# Patient Record
Sex: Female | Born: 1975 | Race: White | Hispanic: No | Marital: Married | Smoking: Current every day smoker
Health system: Southern US, Community
[De-identification: ages and names within clinical notes are randomized; demographics above are authoritative.]

## PROBLEM LIST (undated history)

## (undated) DIAGNOSIS — R5382 Chronic fatigue, unspecified: Secondary | ICD-10-CM

## (undated) DIAGNOSIS — F445 Conversion disorder with seizures or convulsions: Secondary | ICD-10-CM

## (undated) DIAGNOSIS — F419 Anxiety disorder, unspecified: Secondary | ICD-10-CM

## (undated) DIAGNOSIS — M797 Fibromyalgia: Secondary | ICD-10-CM

## (undated) DIAGNOSIS — G243 Spasmodic torticollis: Secondary | ICD-10-CM

## (undated) DIAGNOSIS — G9332 Myalgic encephalomyelitis/chronic fatigue syndrome: Secondary | ICD-10-CM

## (undated) HISTORY — PX: ABDOMINAL HYSTERECTOMY: SHX81

---

## 2008-01-24 ENCOUNTER — Encounter: Admission: RE | Admit: 2008-01-24 | Discharge: 2008-01-24 | Payer: Self-pay | Admitting: Emergency Medicine

## 2008-01-31 ENCOUNTER — Encounter: Admission: RE | Admit: 2008-01-31 | Discharge: 2008-01-31 | Payer: Self-pay | Admitting: Emergency Medicine

## 2008-02-25 ENCOUNTER — Encounter: Admission: RE | Admit: 2008-02-25 | Discharge: 2008-02-25 | Payer: Self-pay | Admitting: Emergency Medicine

## 2018-02-11 ENCOUNTER — Encounter: Payer: Self-pay | Admitting: Emergency Medicine

## 2018-02-11 ENCOUNTER — Emergency Department
Admission: EM | Admit: 2018-02-11 | Discharge: 2018-02-12 | Disposition: A | Discharge status: Home / Self Care | Payer: Self-pay | Attending: Emergency Medicine | Admitting: Emergency Medicine

## 2018-02-11 ENCOUNTER — Other Ambulatory Visit: Payer: Self-pay

## 2018-02-11 DIAGNOSIS — F17200 Nicotine dependence, unspecified, uncomplicated: Secondary | ICD-10-CM | POA: Insufficient documentation

## 2018-02-11 DIAGNOSIS — R45851 Suicidal ideations: Secondary | ICD-10-CM | POA: Insufficient documentation

## 2018-02-11 DIAGNOSIS — F1910 Other psychoactive substance abuse, uncomplicated: Secondary | ICD-10-CM | POA: Insufficient documentation

## 2018-02-11 DIAGNOSIS — F320 Major depressive disorder, single episode, mild: Secondary | ICD-10-CM | POA: Insufficient documentation

## 2018-02-11 DIAGNOSIS — F419 Anxiety disorder, unspecified: Secondary | ICD-10-CM | POA: Insufficient documentation

## 2018-02-11 HISTORY — DX: Spasmodic torticollis: G24.3

## 2018-02-11 HISTORY — DX: Anxiety disorder, unspecified: F41.9

## 2018-02-11 HISTORY — DX: Fibromyalgia: M79.7

## 2018-02-11 HISTORY — DX: Chronic fatigue, unspecified: R53.82

## 2018-02-11 HISTORY — DX: Conversion disorder with seizures or convulsions: F44.5

## 2018-02-11 HISTORY — DX: Myalgic encephalomyelitis/chronic fatigue syndrome: G93.32

## 2018-02-11 LAB — COMPREHENSIVE METABOLIC PANEL
ALK PHOS: 51 U/L (ref 38–126)
ALT: 12 U/L (ref 0–44)
ANION GAP: 5 (ref 5–15)
AST: 13 U/L — ABNORMAL LOW (ref 15–41)
Albumin: 4.2 g/dL (ref 3.5–5.0)
BUN: 11 mg/dL (ref 6–20)
CALCIUM: 9.5 mg/dL (ref 8.9–10.3)
CO2: 35 mmol/L — AB (ref 22–32)
Chloride: 102 mmol/L (ref 98–111)
Creatinine, Ser: 0.68 mg/dL (ref 0.44–1.00)
GFR calc non Af Amer: >60 mL/min (ref >60)
Glucose, Bld: 82 mg/dL (ref 70–99)
Potassium: 4.1 mmol/L (ref 3.5–5.1)
SODIUM: 142 mmol/L (ref 135–145)
TOTAL PROTEIN: 6.9 g/dL (ref 6.5–8.1)
Total Bilirubin: 0.4 mg/dL (ref 0.3–1.2)

## 2018-02-11 LAB — URINE DRUG SCREEN, QUALITATIVE (ARMC ONLY)
Amphetamines, Ur Screen: POSITIVE — AB
BARBITURATES, UR SCREEN: NOT DETECTED
BENZODIAZEPINE, UR SCRN: NOT DETECTED
CANNABINOID 50 NG, UR ~~LOC~~: NOT DETECTED
Cocaine Metabolite,Ur ~~LOC~~: POSITIVE — AB
MDMA (Ecstasy)Ur Screen: NOT DETECTED
Methadone Scn, Ur: NOT DETECTED
Opiate, Ur Screen: NOT DETECTED
Phencyclidine (PCP) Ur S: NOT DETECTED
TRICYCLIC, UR SCREEN: NOT DETECTED

## 2018-02-11 LAB — CBC
HCT: 41.5 % (ref 36.0–46.0)
Hemoglobin: 13.1 g/dL (ref 12.0–15.0)
MCH: 26.3 pg (ref 26.0–34.0)
MCHC: 31.6 g/dL (ref 30.0–36.0)
MCV: 83.3 fL (ref 80.0–100.0)
PLATELETS: 357 10*3/uL (ref 150–400)
RBC: 4.98 MIL/uL (ref 3.87–5.11)
RDW: 13.1 % (ref 11.5–15.5)
WBC: 8.9 10*3/uL (ref 4.0–10.5)
nRBC: 0 % (ref 0.0–0.2)

## 2018-02-11 LAB — ACETAMINOPHEN LEVEL

## 2018-02-11 LAB — ETHANOL: Alcohol, Ethyl (B): <10 mg/dL (ref <10)

## 2018-02-11 LAB — SALICYLATE LEVEL

## 2018-02-11 NOTE — ED Notes (Signed)
Belongings secured: 1 pair sweat pants, 1 bra, 1 pair sandals, 1 shirt, 1 cell phone.

## 2018-02-11 NOTE — ED Provider Notes (Signed)
[facility] Emergency Department Provider Note ____________________________________________   First MD Initiated Contact with Patient 02/11/18 2038     (approximate)  I have reviewed the triage vital signs and the nursing notes.   HISTORY  Chief Complaint Psychiatric Evaluation    HPI Victoria Marshall is a 42 y.o. female with PMH as noted below who presents for evaluation of suicidal ideation.  Per the patient, she had an argument with her son and stated something along the lines of wanting to put a bullet in her head.  The son then called the police and the patient was placed under involuntary commitment.  The patient states that she just said this but never actually intended to hurt herself.  She does report a history of depression and anxiety but is not on medication, and denies any prior suicidal attempts or suicidal ideation.  She denies any acute medical complaints at this time.  Past Medical History:  Diagnosis Date  . Anxiety   . Cervical dystonia   . Chronic fatigue syndrome   . Fibromyalgia   . Psychogenic nonepileptic seizure     There are no active problems to display for this patient.   Past Surgical History:  Procedure Laterality Date  . ABDOMINAL HYSTERECTOMY    . CESAREAN SECTION     x4    Prior to Admission medications   Not on File    Allergies Patient has no known allergies.  No family history on file.  Social History Social History   Tobacco Use  . Smoking status: Current Every Day Smoker  . Smokeless tobacco: Never Used  Substance Use Topics  . Alcohol use: Never    Frequency: Never  . Drug use: Not on file    Review of Systems  Constitutional: No fever. Eyes: No redness. ENT: No sore throat. Cardiovascular: Denies chest pain. Respiratory: Denies shortness of breath. Gastrointestinal: No vomiting.  Genitourinary: Negative for dysuria.  Musculoskeletal: Negative for back pain. Skin: Negative for  rash. Neurological: Negative for headache.   ____________________________________________   PHYSICAL EXAM:  VITAL SIGNS: ED Triage Vitals  Enc Vitals Group     BP 02/11/18 2014 131/66     Pulse Rate 02/11/18 2014 91     Resp 02/11/18 2014 18     Temp 02/11/18 2014 98.2 F (36.8 C)     Temp Source 02/11/18 2014 Oral     SpO2 02/11/18 2014 100 %     Weight 02/11/18 2015 142 lb (64.4 kg)     Height 02/11/18 2015 5\' 3"  (1.6 m)     Head Circumference --      Peak Flow --      Pain Score 02/11/18 2015 0     Pain Loc --      Pain Edu? --      Excl. in GC? --     Constitutional: Alert and oriented. Well appearing and in no acute distress. Eyes: Conjunctivae are normal.  Head: Atraumatic. Nose: No congestion/rhinnorhea. Mouth/Throat: Mucous membranes are moist.   Neck: Normal range of motion.  Cardiovascular: Good peripheral circulation. Respiratory: Normal respiratory effort.  Gastrointestinal:  No distention.  Musculoskeletal:  Extremities warm and well perfused.  Neurologic:  Normal speech and language. No gross focal neurologic deficits are appreciated.  Skin:  Skin is warm and dry. No rash noted. Psychiatric: Mood and affect are normal. Speech and behavior are normal.  ____________________________________________   LABS (all labs ordered are listed, but only abnormal results are  displayed)  Labs Reviewed  COMPREHENSIVE METABOLIC PANEL - Abnormal; Notable for the following components:      Result Value   CO2 35 (*)    AST 13 (*)    All other components within normal limits  ACETAMINOPHEN LEVEL - Abnormal; Notable for the following components:   Acetaminophen (Tylenol), Serum <10 (*)    All other components within normal limits  URINE DRUG SCREEN, QUALITATIVE ([facility] ONLY) - Abnormal; Notable for the following components:   Amphetamines, Ur Screen POSITIVE (*)    Cocaine Metabolite,Ur [place] POSITIVE (*)    All other components within normal limits  ETHANOL   SALICYLATE LEVEL  CBC   ____________________________________________  EKG   ____________________________________________  RADIOLOGY    ____________________________________________   PROCEDURES  Procedure(s) performed: No  Procedures  Critical Care performed: No ____________________________________________   INITIAL IMPRESSION / ASSESSMENT AND PLAN / ED COURSE  Pertinent labs & imaging results that were available during my care of the patient were reviewed by me and considered in my medical decision making (see chart for details).  42 year old female with PMH as noted above presents under involuntary commitment with concern for suicidal ideation.  Patient states that she made a suicidal statement while in an argument with her son but never actually felt suicidal and denies SI at this time.  She also denies any medical complaints.  On exam the patient is comfortable appearing and her vital signs are normal.  We will obtain TTS consult and disposition will be per behavioral health team recommendations.  ----------------------------------------- 10:41 PM on 02/11/2018 -----------------------------------------  Pending TTS consult and behavioral team recommendations for disposition.  ____________________________________________   FINAL CLINICAL IMPRESSION(S) / ED DIAGNOSES  Final diagnoses:  Suicidal thoughts      NEW MEDICATIONS STARTED DURING THIS VISIT:  New Prescriptions   No medications on file     Note:  This document was prepared using Dragon voice recognition software and may include unintentional dictation errors.    Dionne Bucy, MD 02/11/18 2241

## 2018-02-11 NOTE — ED Triage Notes (Signed)
Patient states she got into argument with son today, states she got mad and stated she "just wanted to put a bullet in her head sometimes". Patient states she didn't really mean it and said it in the heat of the moment. Patient states she doesn't know why she said it, but left the house at that time feeling like it would calm things down. Patient states she then became IVC because of leaving.  Patient states she knows she has anxiety, believes she may have undiagnosed with depression. States husband died one year ago in 01-18-2023.

## 2018-02-12 NOTE — Discharge Instructions (Addendum)
Please follow-up with [facility] to help with your depression symptoms.  They may be to help you little bit with your drug abuse as well.  Please return here for any further problems.
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
## 2018-02-12 NOTE — ED Notes (Signed)
Report given to Dr Mittal (SOC doctor).  

## 2018-02-12 NOTE — ED Provider Notes (Signed)
-----------------------------------------   6:50 AM on 02/12/2018 -----------------------------------------   Blood pressure 129/80, pulse 91, temperature 98 F (36.7 C), temperature source Oral, resp. rate 18, height 5\' 3"  (1.6 m), weight 64.4 kg, SpO2 100 %.  The patient had no acute events since last update.  Calm and cooperative at this time.  Disposition is pending Psychiatry/Behavioral Medicine team recommendations.      Irean Hong, MD 02/12/18 [phone]

## 2018-02-12 NOTE — ED Notes (Addendum)
Patient discharged home with mother, patient received discharge papers and referral to [facility]. Patient received belongings and verbalized she has received all of her belongings. Patient appropriate and cooperative, Denies SI/HI AVH. Vital signs taken. NAD noted.

## 2018-02-12 NOTE — ED Notes (Signed)
BEHAVIORAL HEALTH ROUNDING Patient sleeping: No. Patient alert and oriented: yes Behavior appropriate: Yes.  ; If no, describe:  Nutrition and fluids offered: yes Toileting and hygiene offered: Yes  Sitter present: q15 minute observations and security monitoring Law enforcement present: Yes    
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
## 2018-08-15 ENCOUNTER — Other Ambulatory Visit: Payer: Self-pay

## 2018-08-15 ENCOUNTER — Emergency Department (HOSPITAL_COMMUNITY)
Admission: EM | Admit: 2018-08-15 | Discharge: 2018-08-15 | Disposition: A | Discharge status: Home / Self Care | Payer: Self-pay | Attending: Emergency Medicine | Admitting: Emergency Medicine

## 2018-08-15 ENCOUNTER — Encounter (HOSPITAL_COMMUNITY): Payer: Self-pay

## 2018-08-15 ENCOUNTER — Emergency Department (HOSPITAL_COMMUNITY): Payer: Self-pay

## 2018-08-15 DIAGNOSIS — K5900 Constipation, unspecified: Secondary | ICD-10-CM | POA: Insufficient documentation

## 2018-08-15 DIAGNOSIS — F17200 Nicotine dependence, unspecified, uncomplicated: Secondary | ICD-10-CM | POA: Insufficient documentation

## 2018-08-15 LAB — PREGNANCY, URINE: Preg Test, Ur: NEGATIVE

## 2018-08-15 LAB — URINALYSIS, ROUTINE W REFLEX MICROSCOPIC
Bilirubin Urine: NEGATIVE
Glucose, UA: NEGATIVE mg/dL
Hgb urine dipstick: NEGATIVE
Ketones, ur: NEGATIVE mg/dL
Leukocytes,Ua: NEGATIVE
Nitrite: NEGATIVE
Protein, ur: NEGATIVE mg/dL
Specific Gravity, Urine: 1.026 (ref 1.005–1.030)
pH: 5 (ref 5.0–8.0)

## 2018-08-15 MED ORDER — SENNOSIDES-DOCUSATE SODIUM 8.6-50 MG PO TABS: 2.0000 | ORAL_TABLET | Freq: Every day | ORAL | 0 refills | Status: DC

## 2018-08-15 MED ORDER — POLYETHYLENE GLYCOL 3350 17 G PO PACK: 17.0000 g | PACK | Freq: Two times a day (BID) | ORAL | 0 refills | Status: DC | PRN

## 2018-08-15 NOTE — ED Notes (Signed)
Patient transported to X-ray 

## 2018-08-15 NOTE — Discharge Instructions (Addendum)
You are seen in the emergency department today for constipation.  Your urine did not show a UTI.  X-ray did not show an obstruction.  You are sending you home with MiraLAX and senokot.  Take miralax daily and up to 3 times daily as needed for constipation.  Take two tablets of senokot daily.   We have prescribed you new medication(s) today. Discuss the medications prescribed today with your pharmacist as they can have adverse effects and interactions with your other medicines including over the counter and prescribed medications. Seek medical evaluation if you start to experience new or abnormal symptoms after taking one of these medicines, seek care immediately if you start to experience difficulty breathing, feeling of your throat closing, facial swelling, or rash as these could be indications of a more serious allergic reaction  Follow attached diet guidelines.  It is important that you maintain good hydration drinking 8 or more glasses per day.   Follow up with primary care within 3 days.  Return to the ER for new or worsening symptoms or any other concerns including but not limited to abdominal pain, inability to keep fluids down, no longer passing gas, or any other concerns.

## 2018-08-15 NOTE — ED Triage Notes (Signed)
Pt c/o constipation x3 weeks, nausea and vomiting

## 2018-08-15 NOTE — ED Provider Notes (Signed)
[facility] EMERGENCY DEPARTMENT Provider Note   CSN: [id number] Arrival date & time: 08/15/18  1422    History   Chief Complaint Chief Complaint  Patient presents with  . Abdominal Pain  . Constipation    HPI Victoria Marshall is a 43 y.o. female with a hx of tobacco abuse, fibromyalgia, & chronic fatigue syndrome who presents to the ED w/ complaints of constipation x 3 weeks. Patient notes issues w/ constipation, has really only had 1 true bowel movement in the past 3 weeks and this was 2 days prior during which she had to strain and passed hard stool. She is passing gas. She has had associated nausea w/ 3 episodes of emesis total over the past 1 week related to sxs, no emesis since BM, some nausea persists, tolerating PO though. She states sometimes her abdomen feels bloated, but not painful. She has had issues w/ constipation on and off throughout life, usually can adjust her diet and this will improve, has been trying to eat more vegetables, taking Miralax intermittently but not consistently without much change. Drinks about 4 glasses of water per day, does drink a lot of soda. Denies fever, hematemesis, melena, hematochezia, abdominal pain, chest pain, or dyspnea. On further questions she states it burned very mildly when she urinated earlier today, only happened once unsure if she is getting a UTI, no concern for STD. Prior abdominal surgeries including c-section x 4.      HPI  Past Medical History:  Diagnosis Date  . Anxiety   . Cervical dystonia   . Chronic fatigue syndrome   . Fibromyalgia   . Psychogenic nonepileptic seizure     There are no active problems to display for this patient.   Past Surgical History:  Procedure Laterality Date  . ABDOMINAL HYSTERECTOMY    . CESAREAN SECTION     x4     OB History   No obstetric history on file.      Home Medications    Prior to Admission medications   Not on File    Family History No family history  on file.  Social History Social History   Tobacco Use  . Smoking status: Current Every Day Smoker  . Smokeless tobacco: Never Used  Substance Use Topics  . Alcohol use: Never    Frequency: Never  . Drug use: Not on file     Allergies   Patient has no known allergies.   Review of Systems Review of Systems  Constitutional: Negative for chills and fever.  Respiratory: Negative for cough and shortness of breath.   Cardiovascular: Negative for chest pain.  Gastrointestinal: Positive for constipation, nausea and vomiting. Negative for abdominal pain, anal bleeding, blood in stool and diarrhea.       + for abdominal bloating  Genitourinary: Positive for dysuria. Negative for hematuria, vaginal bleeding and vaginal discharge.  All other systems reviewed and are negative.  Physical Exam Updated Vital Signs BP (!) 112/97 (BP Location: Right Arm)   Pulse 63   Temp 97.8 F (36.6 C) (Oral)   Resp (!) 26   SpO2 99%   Physical Exam Vitals signs and nursing note reviewed.  Constitutional:      General: She is not in acute distress.    Appearance: She is well-developed. She is not toxic-appearing.  HENT:     Head: Normocephalic and atraumatic.  Eyes:     General:        Right eye: No discharge.  Left eye: No discharge.     Conjunctiva/sclera: Conjunctivae normal.  Neck:     Musculoskeletal: Neck supple.  Cardiovascular:     Rate and Rhythm: Normal rate and regular rhythm.  Pulmonary:     Effort: Pulmonary effort is normal. No respiratory distress.     Breath sounds: Normal breath sounds. No wheezing, rhonchi or rales.  Abdominal:     General: There is no distension.     Palpations: Abdomen is soft.     Tenderness: There is no abdominal tenderness. There is no guarding or rebound.  Genitourinary:    Comments: Deferred Skin:    General: Skin is warm and dry.     Findings: No rash.  Neurological:     Mental Status: She is alert.     Comments: Clear speech.    Psychiatric:        Behavior: Behavior normal.    ED Treatments / Results  Labs (all labs ordered are listed, but only abnormal results are displayed) Labs Reviewed  URINALYSIS, ROUTINE W REFLEX MICROSCOPIC - Abnormal; Notable for the following components:      Result Value   APPearance CLOUDY (*)    All other components within normal limits  PREGNANCY, URINE    EKG None  Radiology Dg Abd Acute W/chest  Result Date: 08/15/2018 CLINICAL DATA:  Nausea, vomiting, constipation. EXAM: DG ABDOMEN ACUTE W/ 1V CHEST COMPARISON:  None. FINDINGS: There is no evidence of dilated bowel loops or free intraperitoneal air. No radiopaque calculi or other significant radiographic abnormality is seen. Heart size and mediastinal contours are within normal limits. Both lungs are clear. IMPRESSION: No evidence of bowel obstruction or ileus. No acute cardiopulmonary disease. Electronically Signed   By: Lupita RaiderJames  Green Jr M.D.   On: 08/15/2018 15:34    Procedures Procedures (including critical care time)  Medications Ordered in ED Medications - No data to display   Initial Impression / Assessment and Plan / ED Course  I have reviewed the triage vital signs and the nursing notes.  Pertinent labs & imaging results that were available during my care of the patient were reviewed by me and considered in my medical decision making (see chart for details).  Patient presents to the emergency department w/ complaints of constipation, did have some episodes of emesis earlier last week which have not reoccurred since having a hard BM. Nontoxic appearing, no apparent distress, vitals without significant abnormality- RN documented RR of 26 likely based on inaccurate monitor- she has not been tachypneic throughout ER stay. Abdomen is soft & non-tender w/o peritoneal signs, still passing gas, did have a BM 2 days prior, abdominal x-ray without concerning findings, do not suspect obstruction/perforation. Additional w/  nontender abdomen do not suspect appendicitis, diverticulitis, pancreatitis, or cholecystitis. UA without UTI w/ mention of dysuria x 1, no concern for STD per patient. preg test negative.  Overall constipation appears to be etiology of patient's sxs. I have educated patient on importance of good oral hydration & diet recommendations. Will give prescription for Senokot & miralax- discussed taking this medications consistently for them to be effective. Appears appropriate for discharge home. PCP follow up w/ strict ER return precautions. I discussed results, treatment plan, need for follow-up, and return precautions with the patient. Provided opportunity for questions, patient confirmed understanding and is in agreement with plan.    Vitals:   08/15/18 1445 08/15/18 1606  BP:  110/64  Pulse: 63 (!) 59  Resp: (!) 26 17  Temp:  98.5 F (36.9 C)  SpO2: 99% 100%     Final Clinical Impressions(s) / ED Diagnoses   Final diagnoses:  Constipation, unspecified constipation type    ED Discharge Orders         Ordered    polyethylene glycol (MIRALAX) 17 g packet  2 times daily PRN     08/15/18 1554    senna-docusate (SENOKOT-S) 8.6-50 MG tablet  Daily     08/15/18 1554           Janayla Marik, [place] R, PA-C 08/15/18 1615    Margarita Grizzle, MD 08/15/18 1657

## 2018-10-22 ENCOUNTER — Other Ambulatory Visit: Payer: Self-pay

## 2018-10-22 DIAGNOSIS — Z20822 Contact with and (suspected) exposure to covid-19: Secondary | ICD-10-CM

## 2018-10-26 LAB — NOVEL CORONAVIRUS, NAA: SARS-CoV-2, NAA: NOT DETECTED

## 2018-12-19 ENCOUNTER — Encounter (HOSPITAL_COMMUNITY): Payer: Self-pay | Admitting: Emergency Medicine

## 2018-12-19 ENCOUNTER — Emergency Department (HOSPITAL_COMMUNITY)
Admission: EM | Admit: 2018-12-19 | Discharge: 2018-12-19 | Disposition: A | Discharge status: Home / Self Care | Payer: Self-pay | Attending: Emergency Medicine | Admitting: Emergency Medicine

## 2018-12-19 ENCOUNTER — Other Ambulatory Visit: Payer: Self-pay

## 2018-12-19 ENCOUNTER — Emergency Department (HOSPITAL_COMMUNITY): Payer: Self-pay

## 2018-12-19 DIAGNOSIS — F17200 Nicotine dependence, unspecified, uncomplicated: Secondary | ICD-10-CM | POA: Insufficient documentation

## 2018-12-19 DIAGNOSIS — M549 Dorsalgia, unspecified: Secondary | ICD-10-CM | POA: Insufficient documentation

## 2018-12-19 DIAGNOSIS — Z041 Encounter for examination and observation following transport accident: Secondary | ICD-10-CM | POA: Insufficient documentation

## 2018-12-19 DIAGNOSIS — M542 Cervicalgia: Secondary | ICD-10-CM | POA: Insufficient documentation

## 2018-12-19 DIAGNOSIS — R42 Dizziness and giddiness: Secondary | ICD-10-CM | POA: Insufficient documentation

## 2018-12-19 DIAGNOSIS — R110 Nausea: Secondary | ICD-10-CM | POA: Insufficient documentation

## 2018-12-19 MED ORDER — ONDANSETRON 4 MG PO TBDP: 4.0000 mg | ORAL_TABLET | Freq: Three times a day (TID) | ORAL | 0 refills | Status: AC | PRN

## 2018-12-19 MED ORDER — METHOCARBAMOL 500 MG PO TABS: 500.0000 mg | ORAL_TABLET | Freq: Two times a day (BID) | ORAL | 0 refills | Status: AC

## 2018-12-19 MED ORDER — IBUPROFEN 400 MG PO TABS
600.0000 mg | ORAL_TABLET | Freq: Once | ORAL | Status: AC
  Administered 2018-12-19: 600 mg via ORAL
  Filled 2018-12-19: qty 1

## 2018-12-19 MED ORDER — LIDOCAINE 5 % EX PTCH: 1.0000 | MEDICATED_PATCH | CUTANEOUS | 0 refills | Status: AC

## 2018-12-19 MED ORDER — ONDANSETRON 4 MG PO TBDP
4.0000 mg | ORAL_TABLET | Freq: Once | ORAL | Status: AC
  Administered 2018-12-19: 4 mg via ORAL
  Filled 2018-12-19: qty 1

## 2018-12-19 NOTE — ED Notes (Signed)
Pt asks for some pain medicine.

## 2018-12-19 NOTE — ED Notes (Signed)
Patient verbalizes understanding of discharge instructions. Opportunity for questioning and answers were provided. Armband removed by staff, pt discharged from ED.  
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
## 2018-12-19 NOTE — ED Notes (Signed)
Pt returned from CT °

## 2018-12-19 NOTE — ED Notes (Signed)
Patient transported to X-ray 
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
## 2018-12-19 NOTE — ED Notes (Signed)
Patient transported to CT 

## 2018-12-19 NOTE — Discharge Instructions (Addendum)
Take it easy, but do not lay around too much as this may make any stiffness worse.  Antiinflammatory medications: Take 600 mg of ibuprofen every 6 hours or 440 mg (over the counter dose) to 500 mg (prescription dose) of naproxen every 12 hours for the next 3 days. After this time, these medications may be used as needed for pain. Take these medications with food to avoid upset stomach. Choose only one of these medications, do not take them together. Acetaminophen (generic for Tylenol): Should you continue to have additional pain while taking the ibuprofen or naproxen, you may add in acetaminophen as needed. Your daily total maximum amount of acetaminophen from all sources should be limited to 4000mg /day for persons without liver problems, or 2000mg /day for those with liver problems. Methocarbamol: Methocarbamol (generic for Robaxin) is a muscle relaxer and can help relieve stiff muscles or muscle spasms.  Do not drive or perform other dangerous activities while taking this medication as it can cause drowsiness as well as changes in reaction time and judgement. Lidocaine patches: These are available via either prescription or over-the-counter. The over-the-counter option may be more economical one and are likely just as effective. There are multiple over-the-counter brands, such as Salonpas. Exercises: Be sure to perform the attached exercises starting with three times a week and working up to performing them daily. This is an essential part of preventing long term problems.  Nausea/vomiting: Use the ondansetron (generic for Zofran) for nausea or vomiting.  This medication may not prevent all vomiting or nausea, but can help facilitate better hydration. Things that can help with nausea/vomiting also include peppermint/menthol candies, vitamin B12, and ginger. Follow up: Follow up with a primary care provider for any future management of these complaints. Be sure to follow up within 7-10 days. May also  follow-up with the concussion clinic. Return: Return to the ED should symptoms worsen.  For prescription assistance, may try using prescription discount sites or apps, such as goodrx.com

## 2019-09-09 ENCOUNTER — Other Ambulatory Visit: Payer: Self-pay

## 2019-09-09 ENCOUNTER — Emergency Department (HOSPITAL_COMMUNITY)
Admission: EM | Admit: 2019-09-09 | Discharge: 2019-09-10 | Discharge status: Against Medical Advice | Payer: Medicaid Other | Attending: Emergency Medicine | Admitting: Emergency Medicine

## 2019-09-09 ENCOUNTER — Encounter (HOSPITAL_COMMUNITY): Payer: Self-pay

## 2019-09-09 DIAGNOSIS — Z5321 Procedure and treatment not carried out due to patient leaving prior to being seen by health care provider: Secondary | ICD-10-CM | POA: Insufficient documentation

## 2019-09-09 DIAGNOSIS — R220 Localized swelling, mass and lump, head: Secondary | ICD-10-CM | POA: Diagnosis not present

## 2019-09-09 NOTE — ED Triage Notes (Signed)
Pt arrives POV for eval of L sided mouth/face and neck swelling. Pt reports that she felt as though she had an abcessed tooth yesterday, and awok today w/ significant facial swelling. Oropharynx is patent and clear, no SOB, managing own secretions well in triage.

## 2019-12-26 ENCOUNTER — Ambulatory Visit: Payer: Medicaid Other
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
